# Patient Record
Sex: Female | Born: 1970
Health system: Southern US, Community
[De-identification: ages and names within clinical notes are randomized; demographics above are authoritative.]

## PROBLEM LIST (undated history)

## (undated) DIAGNOSIS — O2230 Deep phlebothrombosis in pregnancy, unspecified trimester: Secondary | ICD-10-CM

## (undated) DIAGNOSIS — D693 Immune thrombocytopenic purpura: Secondary | ICD-10-CM

## (undated) HISTORY — DX: Deep phlebothrombosis in pregnancy, unspecified trimester: O22.30

## (undated) HISTORY — DX: Immune thrombocytopenic purpura: D69.3

---

## 2014-04-07 ENCOUNTER — Other Ambulatory Visit (HOSPITAL_COMMUNITY)
Admission: RE | Admit: 2014-04-07 | Discharge: 2014-04-07 | Disposition: A | Discharge status: Home / Self Care | Payer: Managed Care, Other (non HMO) | Source: Ambulatory Visit | Attending: Obstetrics & Gynecology | Admitting: Obstetrics & Gynecology

## 2014-04-07 DIAGNOSIS — R87810 Cervical high risk human papillomavirus (HPV) DNA test positive: Secondary | ICD-10-CM | POA: Diagnosis not present

## 2014-04-07 DIAGNOSIS — Z124 Encounter for screening for malignant neoplasm of cervix: Secondary | ICD-10-CM | POA: Diagnosis not present

## 2014-04-07 DIAGNOSIS — Z1151 Encounter for screening for human papillomavirus (HPV): Secondary | ICD-10-CM | POA: Insufficient documentation

## 2014-04-20 ENCOUNTER — Other Ambulatory Visit: Payer: Self-pay

## 2014-04-20 DIAGNOSIS — Z1231 Encounter for screening mammogram for malignant neoplasm of breast: Secondary | ICD-10-CM

## 2014-04-29 ENCOUNTER — Ambulatory Visit
Admission: RE | Admit: 2014-04-29 | Discharge: 2014-04-29 | Disposition: A | Discharge status: Home / Self Care | Payer: Managed Care, Other (non HMO) | Source: Ambulatory Visit

## 2014-04-29 DIAGNOSIS — Z1231 Encounter for screening mammogram for malignant neoplasm of breast: Secondary | ICD-10-CM

## 2014-05-02 ENCOUNTER — Other Ambulatory Visit: Payer: Self-pay | Admitting: Obstetrics & Gynecology

## 2014-05-02 DIAGNOSIS — R928 Other abnormal and inconclusive findings on diagnostic imaging of breast: Secondary | ICD-10-CM

## 2014-05-12 ENCOUNTER — Ambulatory Visit
Admission: RE | Admit: 2014-05-12 | Discharge: 2014-05-12 | Disposition: A | Discharge status: Home / Self Care | Payer: Managed Care, Other (non HMO) | Source: Ambulatory Visit | Attending: Obstetrics & Gynecology | Admitting: Obstetrics & Gynecology

## 2014-05-12 DIAGNOSIS — R928 Other abnormal and inconclusive findings on diagnostic imaging of breast: Secondary | ICD-10-CM

## 2014-05-16 ENCOUNTER — Telehealth: Payer: Self-pay | Admitting: Oncology

## 2014-05-16 NOTE — Telephone Encounter (Signed)
S/W PT IN REF TO NP APPT. ON 05/25/14@10 :30 REFERRING DR Caro Hight

## 2014-05-17 ENCOUNTER — Telehealth: Payer: Self-pay | Admitting: Oncology

## 2014-05-17 NOTE — Telephone Encounter (Signed)
C/D 05/17/14 for appt. 05/25/14

## 2014-05-20 ENCOUNTER — Other Ambulatory Visit: Payer: Self-pay | Admitting: Oncology

## 2014-05-20 DIAGNOSIS — Z86718 Personal history of other venous thrombosis and embolism: Secondary | ICD-10-CM

## 2014-05-25 ENCOUNTER — Encounter (INDEPENDENT_AMBULATORY_CARE_PROVIDER_SITE_OTHER): Payer: Self-pay

## 2014-05-25 ENCOUNTER — Encounter: Payer: Self-pay | Admitting: Oncology

## 2014-05-25 ENCOUNTER — Ambulatory Visit (HOSPITAL_BASED_OUTPATIENT_CLINIC_OR_DEPARTMENT_OTHER): Payer: Managed Care, Other (non HMO)

## 2014-05-25 ENCOUNTER — Ambulatory Visit (HOSPITAL_BASED_OUTPATIENT_CLINIC_OR_DEPARTMENT_OTHER): Payer: Managed Care, Other (non HMO) | Admitting: Oncology

## 2014-05-25 ENCOUNTER — Telehealth: Payer: Self-pay | Admitting: Oncology

## 2014-05-25 ENCOUNTER — Other Ambulatory Visit (HOSPITAL_BASED_OUTPATIENT_CLINIC_OR_DEPARTMENT_OTHER): Payer: Managed Care, Other (non HMO)

## 2014-05-25 VITALS — BP 123/77 | HR 73 | Temp 97.6°F | Resp 18 | Wt 208.9 lb

## 2014-05-25 DIAGNOSIS — Z23 Encounter for immunization: Secondary | ICD-10-CM

## 2014-05-25 DIAGNOSIS — D693 Immune thrombocytopenic purpura: Secondary | ICD-10-CM

## 2014-05-25 DIAGNOSIS — Z86718 Personal history of other venous thrombosis and embolism: Secondary | ICD-10-CM

## 2014-05-25 DIAGNOSIS — O2230 Deep phlebothrombosis in pregnancy, unspecified trimester: Secondary | ICD-10-CM

## 2014-05-25 LAB — CBC WITH DIFFERENTIAL/PLATELET
BASO%: 1.3 % (ref 0.0–2.0)
BASOS ABS: 0.1 10*3/uL (ref 0.0–0.1)
EOS%: 4.1 % (ref 0.0–7.0)
Eosinophils Absolute: 0.3 10*3/uL (ref 0.0–0.5)
HCT: 45 % (ref 34.8–46.6)
HGB: 14.7 g/dL (ref 11.6–15.9)
LYMPH%: 48.6 % (ref 14.0–49.7)
MCH: 29 pg (ref 25.1–34.0)
MCHC: 32.6 g/dL (ref 31.5–36.0)
MCV: 88.9 fL (ref 79.5–101.0)
MONO#: 0.9 10*3/uL (ref 0.1–0.9)
MONO%: 12.1 % (ref 0.0–14.0)
NEUT#: 2.6 10*3/uL (ref 1.5–6.5)
NEUT%: 33.9 % — ABNORMAL LOW (ref 38.4–76.8)
Platelets: 255 10*3/uL (ref 145–400)
RBC: 5.06 10*6/uL (ref 3.70–5.45)
RDW: 13.4 % (ref 11.2–14.5)
WBC: 7.6 10*3/uL (ref 3.9–10.3)
lymph#: 3.7 10*3/uL — ABNORMAL HIGH (ref 0.9–3.3)

## 2014-05-25 LAB — COMPREHENSIVE METABOLIC PANEL (CC13)
ALBUMIN: 4.2 g/dL (ref 3.5–5.0)
ALT: 18 U/L (ref 0–55)
AST: 15 U/L (ref 5–34)
Alkaline Phosphatase: 40 U/L (ref 40–150)
Anion Gap: 8 mEq/L (ref 3–11)
BUN: 15.8 mg/dL (ref 7.0–26.0)
CO2: 27 mEq/L (ref 22–29)
Calcium: 10.1 mg/dL (ref 8.4–10.4)
Chloride: 106 mEq/L (ref 98–109)
Creatinine: 0.7 mg/dL (ref 0.6–1.1)
Glucose: 88 mg/dl (ref 70–140)
POTASSIUM: 4.2 meq/L (ref 3.5–5.1)
SODIUM: 140 meq/L (ref 136–145)
Total Bilirubin: 0.64 mg/dL (ref 0.20–1.20)
Total Protein: 6.9 g/dL (ref 6.4–8.3)

## 2014-05-25 LAB — CHCC SMEAR

## 2014-05-25 MED ORDER — INFLUENZA VAC SPLIT QUAD 0.5 ML IM SUSY
0.5000 mL | PREFILLED_SYRINGE | Freq: Once | INTRAMUSCULAR | Status: AC
  Administered 2014-05-25: 0.5 mL via INTRAMUSCULAR
  Filled 2014-05-25: qty 0.5

## 2014-05-25 NOTE — Telephone Encounter (Signed)
gv adn printed appt sched and avs for pt for Jan 2016 °

## 2014-05-25 NOTE — Progress Notes (Signed)
Please see consult note.  

## 2014-05-25 NOTE — Consult Note (Signed)
Reason for Referral: History of ITP and thrombosis   HPI: This is a 43 year old woman native of [place] but have been living in this area for the last 10 years. 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 The course of her disease has been chronic and relapsing at times. She required multiple therapies including a [place] as well as prednisone, IVIG and rituximab. Since she [place] [facility] [place], she was seen a hematologist in the [place] area [place] well as occasionally in [place]. Her last relapse was close to 3 years ago. She also had had a history of deep vein thrombosis around 2005. She was pregnant with twins was on bedrest. He developed postpartum DVT after a C-section and immobilization. 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 And she was placed on Coumadin indefinitely since that time. She has been on Coumadin since that time to about 2 weeks ago when her prescription ran out and she's been off.  Clinically, she is asymptomatic at this time. She had not had any blood clots for the last 10 years she did have an abortion because she was found not to be pregnant 3 months into her pregnancy while she was on Coumadin and she elected to abort the fetus. She did not have any pregnancy complications before that. She never had any other clotting symptoms previously. She does not have any family history of ITP or deep vein thrombosis. She does not report any headaches or blurry vision or migraines. She does not report any chest pain, palpitation, orthopnea or PND. Does not report any cough, wheezing or shortness of breath. Does not report any nausea, vomiting, hematochezia, melena. She has not reported any petechiae, ecchymosis or any bleeding. She has not reported any lymphadenopathy or any constitutional symptoms. She continues to work full-time and take care of her  twin children and they are close to 41 years old. Her similar review of systems unremarkable.   Past Medical History  Diagnosis Date  . ITP (idiopathic thrombocytopenic purpura)   . DVT (deep vein thrombosis) in pregnancy   :   Current Outpatient Prescriptions  Medication Sig Dispense Refill  . Cinnamon 500 MG capsule Take 1,000 mg by mouth.      . Linoleic Acid-Sunflower Oil (CLA) [phone] MG CAPS Take 1,000 mg by mouth.      . Multiple Vitamins tablet Take by mouth.      . Multiple Vitamins-Minerals (THERA-M) TABS Take 1 tablet by mouth.      . polycarbophil (FIBERCON) 625 MG tablet Take 625 mg by mouth.      Marland Kitchen. VITAMIN B1-B12 IM Inject into the muscle.      . phentermine 37.5 MG capsule Take 37.5 mg by mouth.      . warfarin (COUMADIN) 5 MG tablet Take 5 mg by mouth.       No current facility-administered medications for this visit.        Allergies  Allergen Reactions  . Doxycycline     Other reaction(s): Other (See Comments) Severe chest pain  :  No family history on file.:  History   Social History  . Marital Status: Unknown    Spouse Name: N/A    Number of Children: N/A  . Years of Education: N/A   Occupational History  . Not on file.   Social History Main Topics  .  Smoking status: Not on file  . Smokeless tobacco: Not on file  . Alcohol Use: Not on file  . Drug Use: Not on file  . Sexual Activity: Not on file   Other Topics Concern  . Not on file   Social History Narrative  . No narrative on file  :  Pertinent items are noted in HPI.  Exam: Blood pressure 123/77, pulse 73, temperature 97.6 F (36.4 C), temperature source Oral, resp. rate 18, weight 208 lb 14.4 oz (94.756 kg), SpO2 100.00%. General appearance: alert and cooperative Head: Normocephalic, without obvious abnormality Throat: lips, mucosa, and tongue normal; teeth and gums normal Neck: no adenopathy Back: symmetric, no curvature. ROM normal. No CVA tenderness. Resp: clear to  auscultation bilaterally Chest wall: no tenderness Cardio: regular rate and rhythm, S1, S2 normal, no murmur, click, rub or gallop GI: soft, non-tender; bowel sounds normal; no masses,  no organomegaly Extremities: extremities normal, atraumatic, no cyanosis or edema Pulses: 2+ and symmetric Skin: Skin color, texture, turgor normal. No rashes or lesions Lymph nodes: Cervical, supraclavicular, and axillary nodes normal.   Recent Labs  05/25/14 1110  WBC 7.6  HGB 14.7  HCT 45.0  PLT 255       Assessment and Plan:    43 year old woman with the following issues:  1. History of ITP: She appears to have been diagnosed as a child and a course of her disease has been chronic relapsing. She is status post splenectomy as well as many other treatments in the past that includes steroids, IVIG and Rituxan. Her last relapse was over 3 years ago and continues to be in remission. Her platelet count is perfectly normal at 255. The natural course of her disease was discussed and she is clearly well-informed since she have lived with this disease the majority of her life. I continue to indicate about signs and symptoms of relapse including petechiae, ecchymosis or bleeding. There is no need for treatment at this time and certainly we can treat her with prednisone plus or minus IVIG she develops relapse.  2. Deep vein thrombosis: She had 2 episodes they're very close to each other and possibly could be attributed it to 1 event. This episode occurred around 2005 after she was pregnant with twins, was on bedrest and required a C-section and immobilized postpartum. It is unclear to me whether her history mandates lifetime anticoagulation. Risks and benefits of full dose anticoagulation especially in the setting of relapsing ITP was discussed. The plan at this point is to obtain a hypercoagulable panel to rule out antiphospholipid antibody syndrome which could manifest itself with both ITP and thrombosis. If  she has no inherited thrombophilia, I am inclined to recommend low-dose aspirin for the time being and use full dose anticoagulation if she develops another clot. She would be a good candidate for Noel Christmas if she develops any other future blood clots.  3. Health maintenance: I have recommended that she establish care with a primary care physician for health maintenance issues.  4. Followup: Will be in 3 months to recheck her blood count.

## 2014-05-25 NOTE — Progress Notes (Signed)
Checked in new patient with no financial issues prior to seeing the dr. She has appt card and has not been out of the country, °

## 2014-05-28 LAB — HYPERCOAGULABLE PANEL, COMPREHENSIVE
ANTICARDIOLIPIN IGA: 4 U/mL (ref <22)
ANTICARDIOLIPIN IGG: 3 GPL U/mL (ref <23)
ANTITHROMB III FUNC: 113 % (ref 76–126)
Anticardiolipin IgM: 0 MPL U/mL (ref <11)
BETA-2-GLYCOPROTEIN I IGA: 2 A Units (ref <20)
Beta-2 Glyco I IgG: 1 G Units (ref <20)
Beta-2-Glycoprotein I IgM: 2 M Units (ref <20)
DRVVT: 29.6 secs (ref <42.9)
Lupus Anticoagulant: NOT DETECTED
PTT LA: 29.2 s (ref 28.0–43.0)
Protein C Activity: 151 % — ABNORMAL HIGH (ref 75–133)
Protein C, Total: 101 % (ref 72–160)
Protein S Activity: 71 % (ref 69–129)
Protein S Total: 77 % (ref 60–150)

## 2014-08-30 ENCOUNTER — Other Ambulatory Visit (HOSPITAL_BASED_OUTPATIENT_CLINIC_OR_DEPARTMENT_OTHER): Payer: Managed Care, Other (non HMO)

## 2014-08-30 ENCOUNTER — Telehealth: Payer: Self-pay | Admitting: Oncology

## 2014-08-30 ENCOUNTER — Ambulatory Visit (HOSPITAL_BASED_OUTPATIENT_CLINIC_OR_DEPARTMENT_OTHER): Payer: Managed Care, Other (non HMO) | Admitting: Oncology

## 2014-08-30 VITALS — BP 109/60 | HR 95 | Temp 97.7°F | Resp 20 | Wt 209.8 lb

## 2014-08-30 DIAGNOSIS — D693 Immune thrombocytopenic purpura: Secondary | ICD-10-CM

## 2014-08-30 DIAGNOSIS — Z86718 Personal history of other venous thrombosis and embolism: Secondary | ICD-10-CM

## 2014-08-30 DIAGNOSIS — O2230 Deep phlebothrombosis in pregnancy, unspecified trimester: Secondary | ICD-10-CM

## 2014-08-30 DIAGNOSIS — Z7982 Long term (current) use of aspirin: Secondary | ICD-10-CM

## 2014-08-30 DIAGNOSIS — Z23 Encounter for immunization: Secondary | ICD-10-CM

## 2014-08-30 LAB — CBC WITH DIFFERENTIAL/PLATELET
BASO%: 0.2 % (ref 0.0–2.0)
Basophils Absolute: 0 10*3/uL (ref 0.0–0.1)
EOS ABS: 0 10*3/uL (ref 0.0–0.5)
EOS%: 0.6 % (ref 0.0–7.0)
HCT: 44 % (ref 34.8–46.6)
HGB: 14.4 g/dL (ref 11.6–15.9)
LYMPH#: 2.9 10*3/uL (ref 0.9–3.3)
LYMPH%: 41.7 % (ref 14.0–49.7)
MCH: 28.9 pg (ref 25.1–34.0)
MCHC: 32.8 g/dL (ref 31.5–36.0)
MCV: 88 fL (ref 79.5–101.0)
MONO#: 1.1 10*3/uL — ABNORMAL HIGH (ref 0.1–0.9)
MONO%: 16.5 % — ABNORMAL HIGH (ref 0.0–14.0)
NEUT#: 2.8 10*3/uL (ref 1.5–6.5)
NEUT%: 41 % (ref 38.4–76.8)
Platelets: 234 10*3/uL (ref 145–400)
RBC: 4.99 10*6/uL (ref 3.70–5.45)
RDW: 13.5 % (ref 11.2–14.5)
WBC: 6.9 10*3/uL (ref 3.9–10.3)

## 2014-08-30 LAB — COMPREHENSIVE METABOLIC PANEL (CC13)
ALT: 19 U/L (ref 0–55)
AST: 18 U/L (ref 5–34)
Albumin: 4.1 g/dL (ref 3.5–5.0)
Alkaline Phosphatase: 40 U/L (ref 40–150)
Anion Gap: 8 mEq/L (ref 3–11)
BUN: 12 mg/dL (ref 7.0–26.0)
CO2: 28 mEq/L (ref 22–29)
Calcium: 9.2 mg/dL (ref 8.4–10.4)
Chloride: 103 mEq/L (ref 98–109)
Creatinine: 0.7 mg/dL (ref 0.6–1.1)
EGFR: >90 mL/min/{1.73_m2} (ref >90)
Glucose: 104 mg/dl (ref 70–140)
POTASSIUM: 4.3 meq/L (ref 3.5–5.1)
SODIUM: 139 meq/L (ref 136–145)
TOTAL PROTEIN: 6.4 g/dL (ref 6.4–8.3)
Total Bilirubin: 0.78 mg/dL (ref 0.20–1.20)

## 2014-08-30 NOTE — Telephone Encounter (Signed)
, °

## 2014-08-30 NOTE — Progress Notes (Signed)
Hematology and Oncology Follow Up Visit  Bonnie Mendez [id number] 02/25/1971 44 y.o. 08/30/2014 8:28 AM   Principle Diagnosis: 44 year old woman with a long-standing history of ITP diagnosed when she was 44 years of age. She also has deep vein thrombosis diagnosed in 2005 associated with pregnancy. She also developed another episode 2 weeks later.   Prior Therapy: She required multiple therapies including a splenectomy was done in [place] as well as prednisone, IVIG and rituximab. Since she relocated to [facility], she was seen a hematologist in the [place] area as well as occasionally in [place]. Her last relapse was close to 3 years ago.   She is status post anticoagulation with Coumadin after her deep pain thrombosis but she has been off of it as of late.   Current therapy: Observation and surveillance.  Interim History:  Ms. Bovee presents today for a follow-up visit. Since her last visit, she has been doing very well. She has not reported any bleeding complications such as epistaxis, petechiae or ecchymosis. She does not report any GI or GU bleeding. She had not had any other blood clots including DVT or pulmonary emboli. She did not have any phlebitis or pulmonary venous circulation. She does not report any chest pain, palpitation, orthopnea or PND. Does not report any cough, wheezing or shortness of breath. Does not report any nausea, vomiting, hematochezia, melena. She has not reported any lymphadenopathy or any constitutional symptoms. Rest of the review of systems unremarkable.  Medications: I have reviewed the patient's current medications.  Current Outpatient Prescriptions  Medication Sig Dispense Refill  . Cinnamon 500 MG capsule Take 1,000 mg by mouth.    . Linoleic Acid-Sunflower Oil (CLA) [phone] MG CAPS Take 1,000 mg by mouth.    . Multiple Vitamins tablet Take by mouth.    . Multiple Vitamins-Minerals (THERA-M) TABS Take 1 tablet by mouth.    . phentermine  37.5 MG capsule Take 37.5 mg by mouth.    . polycarbophil (FIBERCON) 625 MG tablet Take 625 mg by mouth.    Marland Kitchen VITAMIN B1-B12 IM Inject into the muscle.    . warfarin (COUMADIN) 5 MG tablet Take 5 mg by mouth.     No current facility-administered medications for this visit.     Allergies:  Allergies  Allergen Reactions  . Doxycycline     Other reaction(s): Other (See Comments) Severe chest pain    Past Medical History, Surgical history, Social history, and Family History were reviewed and updated.   Physical Exam: Blood pressure 109/60, pulse 95, temperature 97.7 F (36.5 C), temperature source Oral, resp. rate 20, weight 209 lb 12.8 oz (95.165 kg), SpO2 100 %. ECOG: 0  General appearance: alert and cooperative Head: Normocephalic, without obvious abnormality Neck: no adenopathy Lymph nodes: Cervical, supraclavicular, and axillary nodes normal. Heart:regular rate and rhythm, S1, S2 normal, no murmur, click, rub or gallop Lung:chest clear, no wheezing, rales, normal symmetric air entry Abdomin: soft, non-tender, without masses or organomegaly EXT:no erythema, induration, or nodules   Lab Results: Lab Results  Component Value Date   WBC 6.9 08/30/2014   HGB 14.4 08/30/2014   HCT 44.0 08/30/2014   MCV 88.0 08/30/2014   PLT 234 08/30/2014     Chemistry      Component Value Date/Time   NA 140 05/25/2014 1110   K 4.2 05/25/2014 1110   CO2 27 05/25/2014 1110   BUN 15.8 05/25/2014 1110   CREATININE 0.7 05/25/2014 1110      Component Value Date/Time  CALCIUM 10.1 05/25/2014 1110   ALKPHOS 40 05/25/2014 1110   AST 15 05/25/2014 1110   ALT 18 05/25/2014 1110   BILITOT 0.64 05/25/2014 1110         Impression and Plan:   44 year old woman with the following issues:  1. History of ITP: She appears to have been diagnosed as a child and a course of her disease has been chronic relapsing. She is status post splenectomy as well as many other treatments in the past  that includes steroids, IVIG and Rituxan. Her platelet count is perfectly normal at this time without any evidence of relapse. We'll continue to follow her periodically and educated her about signs and symptoms of relapsed ITP.  2. Deep vein thrombosis: She had 2 episodes they're very close to each other and possibly could be attributed it to 1 event. This episode occurred around 2005 after she was pregnant with twins, was on bedrest and required a C-section and immobilized postpartum. Her hypercoagulable workup repeated in October 2015 and showed no evidence of inherited thrombophilia. I recommended continuing aspirin at this time after discussing the risks and benefits of long-term full dose anticoagulation. She is agreeable at this time and she understands that she might require left on anticoagulation if he develops any future episodes.   3. Follow-up: Will be in 6 months.  [facility],Lyndzie Zentz, MD 1/12/20168:28 AM

## 2014-08-30 NOTE — Addendum Note (Signed)
Addended by: Reesa ChewSMITH, Vonzell Lindblad on: 08/30/2014 08:40 AM   Modules accepted: Medications

## 2014-11-04 ENCOUNTER — Other Ambulatory Visit (HOSPITAL_COMMUNITY)
Admission: RE | Admit: 2014-11-04 | Discharge: 2014-11-04 | Disposition: A | Discharge status: Home / Self Care | Payer: BLUE CROSS/BLUE SHIELD | Source: Ambulatory Visit | Attending: Obstetrics & Gynecology | Admitting: Obstetrics & Gynecology

## 2014-11-04 ENCOUNTER — Other Ambulatory Visit: Payer: Self-pay | Admitting: Obstetrics & Gynecology

## 2014-11-04 DIAGNOSIS — R87810 Cervical high risk human papillomavirus (HPV) DNA test positive: Secondary | ICD-10-CM | POA: Diagnosis present

## 2014-11-04 DIAGNOSIS — Z1151 Encounter for screening for human papillomavirus (HPV): Secondary | ICD-10-CM | POA: Insufficient documentation

## 2014-11-04 DIAGNOSIS — Z01411 Encounter for gynecological examination (general) (routine) with abnormal findings: Secondary | ICD-10-CM | POA: Insufficient documentation

## 2014-11-07 LAB — CYTOLOGY - PAP

## 2015-01-03 ENCOUNTER — Telehealth: Payer: Self-pay | Admitting: Oncology

## 2015-01-03 NOTE — Telephone Encounter (Signed)
Called and left a message with a new appointment r/s from 7/12

## 2015-01-04 ENCOUNTER — Telehealth: Payer: Self-pay | Admitting: Oncology

## 2015-01-04 ENCOUNTER — Telehealth: Payer: Self-pay

## 2015-01-04 ENCOUNTER — Other Ambulatory Visit (HOSPITAL_BASED_OUTPATIENT_CLINIC_OR_DEPARTMENT_OTHER): Payer: BLUE CROSS/BLUE SHIELD

## 2015-01-04 ENCOUNTER — Other Ambulatory Visit: Payer: Self-pay | Admitting: Oncology

## 2015-01-04 ENCOUNTER — Ambulatory Visit (HOSPITAL_BASED_OUTPATIENT_CLINIC_OR_DEPARTMENT_OTHER): Payer: BLUE CROSS/BLUE SHIELD | Admitting: Oncology

## 2015-01-04 VITALS — BP 133/72 | HR 71 | Temp 97.8°F | Resp 20 | Wt 206.6 lb

## 2015-01-04 DIAGNOSIS — D693 Immune thrombocytopenic purpura: Secondary | ICD-10-CM

## 2015-01-04 LAB — CBC WITH DIFFERENTIAL/PLATELET
BASO%: 0.4 % (ref 0.0–2.0)
BASOS ABS: 0 10*3/uL (ref 0.0–0.1)
EOS%: 3.5 % (ref 0.0–7.0)
Eosinophils Absolute: 0.3 10*3/uL (ref 0.0–0.5)
HCT: 44 % (ref 34.8–46.6)
HEMOGLOBIN: 15.4 g/dL (ref 11.6–15.9)
LYMPH#: 4.4 10*3/uL — AB (ref 0.9–3.3)
LYMPH%: 49.6 % (ref 14.0–49.7)
MCH: 30 pg (ref 25.1–34.0)
MCHC: 35 g/dL (ref 31.5–36.0)
MCV: 85.6 fL (ref 79.5–101.0)
MONO#: 1.2 10*3/uL — AB (ref 0.1–0.9)
MONO%: 13.7 % (ref 0.0–14.0)
NEUT#: 2.9 10*3/uL (ref 1.5–6.5)
NEUT%: 32.8 % — AB (ref 38.4–76.8)
Platelets: 3 10*3/uL — CL (ref 145–400)
RBC: 5.14 10*6/uL (ref 3.70–5.45)
RDW: 13.8 % (ref 11.2–14.5)
WBC: 8.9 10*3/uL (ref 3.9–10.3)
nRBC: 0 % (ref 0–0)

## 2015-01-04 LAB — TECHNOLOGIST REVIEW

## 2015-01-04 MED ORDER — DEXAMETHASONE 4 MG PO TABS: ORAL_TABLET | ORAL | Status: DC

## 2015-01-04 NOTE — Telephone Encounter (Signed)
s.w. pt and advised on todays appt.....pt ok and aware °

## 2015-01-04 NOTE — Telephone Encounter (Signed)
Gave and printed appt sched and avs for pt for May and JUNE °

## 2015-01-04 NOTE — Progress Notes (Signed)
Hematology and Oncology Follow Up Visit  Phill MutterJennifer Weidemann [id number] 1971-05-08 44 y.o. 01/04/2015 3:03 PM   Principle Diagnosis: 44 year old woman with a long-standing history of ITP diagnosed when she was 44 years of age. She also has deep vein thrombosis diagnosed in 2005 associated with pregnancy. She also developed another episode 2 weeks later.   Prior Therapy: She required multiple therapies including a splen[place] was done in [place] as well as prednisone, IVIG and rituximab. Since she relocated to [place], she was seen a hematologist in the [place] area as well as occasionally in [place]. Her last relapse was close to 3 years ago.   She is status post anticoagulation with Coumadin after her deep pain thrombosis but she has been off of it as of late.   Current therapy: Observation and surveillance.  Interim History:  Ms. Lu DuffelStuart presents today for a walk-in appointment. Since her last visit, she has been doing very well up until the last 24 hours. She developed acute onset of easy bruisability, and petechiae noted on her lower extremities. She reported that generally overall good health but have been under stress at work. 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 Today she noticed some bleeding after blowing her nose and brushing her teeth and call us today for a follow-up. She does not report any chest pain, palpitation, orthopnea or PND. Does not report any cough, wheezing or shortness of breath. Does not report any nausea, vomiting, hematochezia, melena. She has not reported any lymphadenopathy or any constitutional symptoms. Rest of the review of systems unremarkable.  Medications: I have reviewed the patient's current medications.  Current Outpatient Prescriptions  Medication Sig Dispense Refill  . Cinnamon 500 MG capsule Take 1,000 mg by mouth.    . Linoleic Acid-Sunflower Oil (CLA) [phone] MG CAPS Take 1,000  mg by mouth.    . Multiple Vitamins-Minerals (THERA-M) TABS Take 1 tablet by mouth.    . phentermine 37.5 MG capsule Take 37.5 mg by mouth.    . polycarbophil (FIBERCON) 625 MG tablet Take 625 mg by mouth.    Marland Kitchen. VITAMIN B1-B12 IM Inject into the muscle.    Marland Kitchen. dexamethasone (DECADRON) 4 MG tablet Take 10 tablets daily for 4 days. 40 tablet 0   No current facility-administered medications for this visit.     Allergies:  Allergies  Allergen Reactions  . Doxycycline Other (See Comments)    Severe chest pain    Past Medical History, Surgical history, Social history, and Family History were reviewed and updated.   Physical Exam: Blood pressure 133/72, pulse 71, temperature 97.8 F (36.6 C), temperature source Oral, resp. rate 20, weight 206 lb 9.6 oz (93.713 kg), SpO2 100 %. ECOG: 0  General appearance: alert and cooperative Head: Normocephalic, without obvious abnormality Neck: no adenopathy  Oral mucosa is moist and pink without any mucosal bleeding. Lymph nodes: Cervical, supraclavicular, and axillary nodes normal. Heart:regular rate and rhythm, S1, S2 normal, no murmur, click, rub or gallop Lung:chest clear, no wheezing, rales, normal symmetric air entry Abdomin: soft, non-tender, without masses or organomegaly EXT:no erythema, induration, or nodules.  Skin: Petechiae noted on her lower extremities. Very little to no ecchymosis noted.    Lab Results: Lab Results  Component Value Date   WBC 8.9 01/04/2015   HGB 15.4 01/04/2015   HCT 44.0 01/04/2015   MCV 85.6 01/04/2015   PLT 3 Large & giant platelets* 01/04/2015  Chemistry      Component Value Date/Time   NA 139 08/30/2014 0807   K 4.3 08/30/2014 0807   CO2 28 08/30/2014 0807   BUN 12.0 08/30/2014 0807   CREATININE 0.7 08/30/2014 0807      Component Value Date/Time   CALCIUM 9.2 08/30/2014 0807   ALKPHOS 40 08/30/2014 0807   AST 18 08/30/2014 0807   ALT 19 08/30/2014 0807   BILITOT 0.78 08/30/2014 0807          Impression and Plan:   44 year old woman with the following issues:  1. History of ITP: She appears to have been diagnosed as a child and a course of her disease has been chronic relapsing. She is status post splenectomy as well as many other treatments in the past that includes steroids, IVIG and Rituxan.   She is a acute onset of petechiae and very little epistaxis. Her platelet count is 3000 confirmed by peripheral smear today. She clearly have relapse at this time which she has done before multiple times. She usually responds well to dexamethasone and prednisone.  I have elected to proceed with a short course of dexamethasone at 40 mg daily for 4 days. I plan on repeating her CBC next week and have her platelet counts not completely normal, I have scheduled her tentatively for IVIG as well. Risks and benefits of steroids were discussed. These would include hyperglycemia, hyperactivity, anxiety and dyspepsia. Complications from IVIG including infusion related complications among others. She is very familiar with these drugs and she received them multiple times in the past.  I gave her clear instructions if she develops persistent epistaxis, hematochezia, melena, or headaches to present urgently to the emergency department.  2. Deep vein thrombosis: She had 2 episodes they're very close to each other and possibly could be attributed it to 1 event. This episode occurred around 2005 after she was pregnant with twins, was on bedrest and required a C-section and immobilized postpartum. Her hypercoagulable workup repeated in October 2015 and showed no evidence of inherited thrombophilia.  I recommend discontinue aspirin at this time given her relapse of her ITP.   3. Follow-up: Will be next week for follow-up regarding her platelet count.  [facility],Kala Gassmann, MD 5/18/20163:03 PM

## 2015-01-04 NOTE — Telephone Encounter (Addendum)
[id number] Pt called stating she needs her labs checked. She has ITP. She has petechiae and starting to bleed.

## 2015-01-04 NOTE — Telephone Encounter (Signed)
She needs to be seen today with a CBC (3:15 MD). Message sent to the schedulers.

## 2015-01-04 NOTE — Telephone Encounter (Signed)
She is having small nose bleeds. She is seeing bruises. When she shaves the nicks bleed longer. She sees petechia in the back of her throat as well.

## 2015-01-10 ENCOUNTER — Other Ambulatory Visit: Payer: Self-pay | Admitting: Oncology

## 2015-01-10 ENCOUNTER — Ambulatory Visit (HOSPITAL_BASED_OUTPATIENT_CLINIC_OR_DEPARTMENT_OTHER): Payer: BLUE CROSS/BLUE SHIELD

## 2015-01-10 ENCOUNTER — Other Ambulatory Visit (HOSPITAL_BASED_OUTPATIENT_CLINIC_OR_DEPARTMENT_OTHER): Payer: BLUE CROSS/BLUE SHIELD

## 2015-01-10 VITALS — BP 106/60 | HR 73 | Temp 98.2°F | Resp 18

## 2015-01-10 DIAGNOSIS — O2230 Deep phlebothrombosis in pregnancy, unspecified trimester: Secondary | ICD-10-CM

## 2015-01-10 DIAGNOSIS — D693 Immune thrombocytopenic purpura: Secondary | ICD-10-CM

## 2015-01-10 LAB — CBC WITH DIFFERENTIAL/PLATELET
BASO%: 0.1 % (ref 0.0–2.0)
Basophils Absolute: 0 10*3/uL (ref 0.0–0.1)
EOS ABS: 0.5 10*3/uL (ref 0.0–0.5)
EOS%: 2.9 % (ref 0.0–7.0)
HCT: 45.1 % (ref 34.8–46.6)
HGB: 15.4 g/dL (ref 11.6–15.9)
LYMPH%: 63.1 % — ABNORMAL HIGH (ref 14.0–49.7)
MCH: 29.7 pg (ref 25.1–34.0)
MCHC: 34.1 g/dL (ref 31.5–36.0)
MCV: 87.1 fL (ref 79.5–101.0)
MONO#: 1.4 10*3/uL — ABNORMAL HIGH (ref 0.1–0.9)
MONO%: 9.1 % (ref 0.0–14.0)
NEUT#: 3.9 10*3/uL (ref 1.5–6.5)
NEUT%: 24.8 % — ABNORMAL LOW (ref 38.4–76.8)
Platelets: 8 10*3/uL — CL (ref 145–400)
RBC: 5.18 10*6/uL (ref 3.70–5.45)
RDW: 14.2 % (ref 11.2–14.5)
WBC: 15.7 10*3/uL — ABNORMAL HIGH (ref 3.9–10.3)
lymph#: 9.9 10*3/uL — ABNORMAL HIGH (ref 0.9–3.3)
nRBC: 0 % (ref 0–0)

## 2015-01-10 MED ORDER — PREDNISONE 20 MG PO TABS: ORAL_TABLET | ORAL | Status: DC

## 2015-01-10 MED ORDER — ACETAMINOPHEN 325 MG PO TABS
ORAL_TABLET | ORAL | Status: AC
  Filled 2015-01-10: qty 2

## 2015-01-10 MED ORDER — DIPHENHYDRAMINE HCL 25 MG PO TABS: 25.0000 mg | ORAL_TABLET | Freq: Once | ORAL | Status: DC

## 2015-01-10 MED ORDER — SODIUM CHLORIDE 0.9 % IV SOLN
Freq: Once | INTRAVENOUS | Status: AC
  Administered 2015-01-10: 10:00:00 via INTRAVENOUS

## 2015-01-10 MED ORDER — DIPHENHYDRAMINE HCL 25 MG PO CAPS
ORAL_CAPSULE | ORAL | Status: AC
  Filled 2015-01-10: qty 1

## 2015-01-10 MED ORDER — IMMUNE GLOBULIN (HUMAN) 20 GM/200ML IV SOLN
90.0000 g | INTRAVENOUS | Status: DC
  Filled 2015-01-10: qty 100

## 2015-01-10 MED ORDER — ACETAMINOPHEN 325 MG PO TABS
650.0000 mg | ORAL_TABLET | Freq: Four times a day (QID) | ORAL | Status: DC | PRN
  Administered 2015-01-10: 650 mg via ORAL

## 2015-01-10 MED ORDER — IMMUNE GLOBULIN (HUMAN) 10 GM/100ML IV SOLN
1.0000 g/kg | Freq: Once | INTRAVENOUS | Status: DC
  Filled 2015-01-10: qty 950

## 2015-01-10 MED ORDER — IMMUNE GLOBULIN (HUMAN) 10 GM/100ML IV SOLN
90.0000 g | Freq: Once | INTRAVENOUS | Status: AC
  Administered 2015-01-10: 90 g via INTRAVENOUS
  Filled 2015-01-10: qty 900

## 2015-01-10 MED ORDER — DIPHENHYDRAMINE HCL 25 MG PO CAPS
25.0000 mg | ORAL_CAPSULE | Freq: Once | ORAL | Status: AC
Start: 2015-01-10 — End: 2015-01-10
  Administered 2015-01-10: 25 mg via ORAL

## 2015-01-10 NOTE — Patient Instructions (Signed)

## 2015-01-10 NOTE — Progress Notes (Signed)
Tylenol ordered PRN, clarified with Reesa Chewixie Smith, RN. Per [place], Dr. Alver FisherShadad's RN, patient is to receive tylenol PO prior to IVIG.

## 2015-01-11 ENCOUNTER — Ambulatory Visit (HOSPITAL_BASED_OUTPATIENT_CLINIC_OR_DEPARTMENT_OTHER): Payer: BLUE CROSS/BLUE SHIELD

## 2015-01-11 ENCOUNTER — Telehealth: Payer: Self-pay | Admitting: *Deleted

## 2015-01-11 ENCOUNTER — Telehealth: Payer: Self-pay | Admitting: Oncology

## 2015-01-11 VITALS — BP 117/65 | HR 76 | Temp 98.2°F | Resp 17

## 2015-01-11 DIAGNOSIS — D693 Immune thrombocytopenic purpura: Secondary | ICD-10-CM | POA: Diagnosis not present

## 2015-01-11 DIAGNOSIS — O2230 Deep phlebothrombosis in pregnancy, unspecified trimester: Secondary | ICD-10-CM

## 2015-01-11 MED ORDER — DIPHENHYDRAMINE HCL 25 MG PO CAPS
ORAL_CAPSULE | ORAL | Status: AC
  Filled 2015-01-11: qty 1

## 2015-01-11 MED ORDER — ACETAMINOPHEN 325 MG PO TABS
ORAL_TABLET | ORAL | Status: AC
  Filled 2015-01-11: qty 2

## 2015-01-11 MED ORDER — ACETAMINOPHEN 325 MG PO TABS
650.0000 mg | ORAL_TABLET | Freq: Once | ORAL | Status: AC
  Administered 2015-01-11: 650 mg via ORAL

## 2015-01-11 MED ORDER — IMMUNE GLOBULIN (HUMAN) 10 GM/100ML IV SOLN
90.0000 g | Freq: Once | INTRAVENOUS | Status: AC
  Administered 2015-01-11: 90 g via INTRAVENOUS
  Filled 2015-01-11: qty 900

## 2015-01-11 MED ORDER — DIPHENHYDRAMINE HCL 25 MG PO CAPS
25.0000 mg | ORAL_CAPSULE | Freq: Once | ORAL | Status: AC
  Administered 2015-01-11: 25 mg via ORAL

## 2015-01-11 MED ORDER — SODIUM CHLORIDE 0.9 % IV SOLN
Freq: Once | INTRAVENOUS | Status: AC
  Administered 2015-01-11: 10:00:00 via INTRAVENOUS

## 2015-01-11 NOTE — Telephone Encounter (Signed)
Moved 6/1 lab/fu to 12:45pm due to center event. Left message for patient and mailed schedule.

## 2015-01-11 NOTE — Telephone Encounter (Signed)
Pt. Is in infusion today for 2nd day of IVIG and would like a note to her work to be excused from work the rest of the week and return to work on Tuesday, 01/17/15

## 2015-01-11 NOTE — Patient Instructions (Signed)

## 2015-01-18 ENCOUNTER — Telehealth: Payer: Self-pay | Admitting: Oncology

## 2015-01-18 ENCOUNTER — Other Ambulatory Visit (HOSPITAL_BASED_OUTPATIENT_CLINIC_OR_DEPARTMENT_OTHER): Payer: BLUE CROSS/BLUE SHIELD

## 2015-01-18 ENCOUNTER — Ambulatory Visit (HOSPITAL_BASED_OUTPATIENT_CLINIC_OR_DEPARTMENT_OTHER): Payer: BLUE CROSS/BLUE SHIELD | Admitting: Oncology

## 2015-01-18 VITALS — BP 116/68 | HR 93 | Temp 98.5°F | Resp 20 | Wt 214.1 lb

## 2015-01-18 DIAGNOSIS — D693 Immune thrombocytopenic purpura: Secondary | ICD-10-CM

## 2015-01-18 LAB — CBC WITH DIFFERENTIAL/PLATELET
BASO%: 0.1 % (ref 0.0–2.0)
BASOS ABS: 0 10*3/uL (ref 0.0–0.1)
EOS%: 0.1 % (ref 0.0–7.0)
Eosinophils Absolute: 0 10*3/uL (ref 0.0–0.5)
HCT: 40.2 % (ref 34.8–46.6)
HEMOGLOBIN: 13.7 g/dL (ref 11.6–15.9)
LYMPH%: 17.1 % (ref 14.0–49.7)
MCH: 29.8 pg (ref 25.1–34.0)
MCHC: 34.1 g/dL (ref 31.5–36.0)
MCV: 87.4 fL (ref 79.5–101.0)
MONO#: 0.5 10*3/uL (ref 0.1–0.9)
MONO%: 3.2 % (ref 0.0–14.0)
NEUT#: 13 10*3/uL — ABNORMAL HIGH (ref 1.5–6.5)
NEUT%: 79.5 % — ABNORMAL HIGH (ref 38.4–76.8)
PLATELETS: 416 10*3/uL — AB (ref 145–400)
RBC: 4.6 10*6/uL (ref 3.70–5.45)
RDW: 14.9 % — ABNORMAL HIGH (ref 11.2–14.5)
WBC: 16.3 10*3/uL — ABNORMAL HIGH (ref 3.9–10.3)
lymph#: 2.8 10*3/uL (ref 0.9–3.3)

## 2015-01-18 NOTE — Progress Notes (Signed)
Hematology and Oncology Follow Up Visit  Bonnie Mendez [id number] 08-15-1971 44 y.o. 01/18/2015 1:22 PM   Principle Diagnosis: 44 year old woman with a long-standing history of ITP diagnosed when she was 44 years of age. She also has deep vein thrombosis diagnosed in 2005 associated with pregnancy. She also developed another episode 2 weeks later.   Prior Therapy: She required multiple therapies including a splenectomy was done in [place] as well as prednisone, IVIG and rituximab. Since she relocated to [facility], she was seen a hematologist in the [place] area as well as occasionally in [place]. Her last relapse was close to 3 years ago.   She is status post anticoagulation with Coumadin after her deep pain thrombosis but she has been off of it as of late.   She relapsed recently required restarted her prednisone as well as IVIG given at a total of 1 g/kg on 01/11/2015 as well as 01/10/2015.  Current therapy: Prednisone 60 mg daily started on 01/10/2015.  Interim History:  [name] presents today for a follow-up visit. Since her last visit, she started prednisone 60 mg daily for relapsed ITP she also received IVIG on 2 separate sessions on 5/24 and 01/11/2015. She tolerated therapy well without any complications. She did not have any infusion-related complications. She is reporting few complications related to prednisone. These would include insomnia, fatigue, emotional instability. She also had some dysphagia at times. She is not reporting any petechiae or ecchymosis. Has not reported any hematochezia or melanoma. Has not reported any headaches or blurry vision.  She does not report any chest pain, palpitation, orthopnea or PND. Does not report any cough, wheezing or shortness of breath. Does not report any nausea, vomiting, hematochezia, melena. She has not reported any lymphadenopathy or any constitutional symptoms. Rest of the review of systems unremarkable.  Medications: I  have reviewed the patient's current medications.  Current Outpatient Prescriptions  Medication Sig Dispense Refill  . Cinnamon 500 MG capsule Take 1,000 mg by mouth.    . Linoleic Acid-Sunflower Oil (CLA) [phone] MG CAPS Take 1,000 mg by mouth.    . Multiple Vitamins-Minerals (THERA-M) TABS Take 1 tablet by mouth.    . phentermine 37.5 MG capsule Take 37.5 mg by mouth.    . polycarbophil (FIBERCON) 625 MG tablet Take 625 mg by mouth.    . predniSONE (DELTASONE) 20 MG tablet Take three tablets daily. 90 tablet 1  . VITAMIN B1-B12 IM Inject into the muscle.     No current facility-administered medications for this visit.     Allergies:  Allergies  Allergen Reactions  . Doxycycline Other (See Comments)    Severe chest pain    Past Medical History, Surgical history, Social history, and Family History were reviewed and updated.   Physical Exam: Blood pressure 116/68, pulse 93, temperature 98.5 F (36.9 C), temperature source Oral, resp. rate 20, weight 214 lb 1.6 oz (97.115 kg), SpO2 98 %. ECOG: 0  General appearance: alert and cooperative Head: Normocephalic, without obvious abnormality Neck: no adenopathy  Oral mucosa is moist and pink without any mucosal bleeding. Lymph nodes: Cervical, supraclavicular, and axillary nodes normal. Heart:regular rate and rhythm, S1, S2 normal, no murmur, click, rub or gallop Lung:chest clear, no wheezing, rales, normal symmetric air entry Abdomin: soft, non-tender, without masses or organomegaly EXT:no erythema, induration, or nodules.  Skin: No petechiae or ecchymosis noted.    Lab Results: Lab Results  Component Value Date   WBC 16.3* 01/18/2015   HGB 13.7 01/18/2015  HCT 40.2 01/18/2015   MCV 87.4 01/18/2015   PLT 416* 01/18/2015     Chemistry      Component Value Date/Time   NA 139 08/30/2014 0807   K 4.3 08/30/2014 0807   CO2 28 08/30/2014 0807   BUN 12.0 08/30/2014 0807   CREATININE 0.7 08/30/2014 0807      Component  Value Date/Time   CALCIUM 9.2 08/30/2014 0807   ALKPHOS 40 08/30/2014 0807   AST 18 08/30/2014 0807   ALT 19 08/30/2014 0807   BILITOT 0.78 08/30/2014 0807         Impression and Plan:   44 year old woman with the following issues:  1. History of ITP: She appears to have been diagnosed as a child and a course of her disease has been chronic relapsing. She is status post splenectomy as well as many other treatments in the past that includes steroids, IVIG and Rituxan.   She relapsed in May 2016 after presenting with petechiae and platelet count of less than 5000. After IVIG and prednisone 60 mg daily she achieved complete hematological response. Her platelet count is normal at this time. The plan is to proceed with a slow prednisone taper with a 10 mg every week. I anticipate that she'll be off prednisone in the next 5-6 weeks. I will check her counts during the tapering process as well as afterwards.   2. Follow-up: Will be in 3 weeks to check her counts and a month after that to check on her status as well.  [facility],Bina Veenstra, MD 6/1/20161:22 PM

## 2015-01-18 NOTE — Telephone Encounter (Signed)
Gave patient avs report and appointments for June and July. Lab/FS 7/20 cancelled due to per 6/1 pof lab 6/22 and lab/AJ 7/27.

## 2015-02-08 ENCOUNTER — Other Ambulatory Visit (HOSPITAL_BASED_OUTPATIENT_CLINIC_OR_DEPARTMENT_OTHER): Payer: BLUE CROSS/BLUE SHIELD

## 2015-02-08 ENCOUNTER — Other Ambulatory Visit: Payer: Self-pay | Admitting: *Deleted

## 2015-02-08 ENCOUNTER — Telehealth: Payer: Self-pay | Admitting: *Deleted

## 2015-02-08 DIAGNOSIS — D693 Immune thrombocytopenic purpura: Secondary | ICD-10-CM

## 2015-02-08 DIAGNOSIS — O2230 Deep phlebothrombosis in pregnancy, unspecified trimester: Secondary | ICD-10-CM

## 2015-02-08 LAB — CBC WITH DIFFERENTIAL/PLATELET
BASO%: 0.2 % (ref 0.0–2.0)
BASOS ABS: 0 10*3/uL (ref 0.0–0.1)
EOS ABS: 0 10*3/uL (ref 0.0–0.5)
EOS%: 0.1 % (ref 0.0–7.0)
HEMATOCRIT: 42.3 % (ref 34.8–46.6)
HEMOGLOBIN: 14.1 g/dL (ref 11.6–15.9)
LYMPH%: 10.6 % — AB (ref 14.0–49.7)
MCH: 30.3 pg (ref 25.1–34.0)
MCHC: 33.4 g/dL (ref 31.5–36.0)
MCV: 90.7 fL (ref 79.5–101.0)
MONO#: 0.3 10*3/uL (ref 0.1–0.9)
MONO%: 2.6 % (ref 0.0–14.0)
NEUT%: 86.5 % — ABNORMAL HIGH (ref 38.4–76.8)
NEUTROS ABS: 9.5 10*3/uL — AB (ref 1.5–6.5)
Platelets: 238 10*3/uL (ref 145–400)
RBC: 4.66 10*6/uL (ref 3.70–5.45)
RDW: 14.7 % — ABNORMAL HIGH (ref 11.2–14.5)
WBC: 10.9 10*3/uL — ABNORMAL HIGH (ref 3.9–10.3)
lymph#: 1.2 10*3/uL (ref 0.9–3.3)

## 2015-02-08 LAB — COMPREHENSIVE METABOLIC PANEL (CC13)
ALT: 26 U/L (ref 0–55)
AST: 20 U/L (ref 5–34)
Albumin: 3.9 g/dL (ref 3.5–5.0)
Alkaline Phosphatase: 28 U/L — ABNORMAL LOW (ref 40–150)
Anion Gap: 8 mEq/L (ref 3–11)
BILIRUBIN TOTAL: 0.74 mg/dL (ref 0.20–1.20)
BUN: 18.5 mg/dL (ref 7.0–26.0)
CALCIUM: 10.1 mg/dL (ref 8.4–10.4)
CHLORIDE: 105 meq/L (ref 98–109)
CO2: 27 mEq/L (ref 22–29)
Creatinine: 0.7 mg/dL (ref 0.6–1.1)
EGFR: >90 mL/min/{1.73_m2} (ref >90)
Glucose: 104 mg/dl (ref 70–140)
Potassium: 4.5 mEq/L (ref 3.5–5.1)
Sodium: 140 mEq/L (ref 136–145)
Total Protein: 7.2 g/dL (ref 6.4–8.3)

## 2015-02-08 NOTE — Telephone Encounter (Signed)
-----   Message from Benjiman Core, MD sent at 02/08/2015 11:42 AM EDT ----- Please let her know that platelet count is normal. Keep doing what she is doing.

## 2015-02-08 NOTE — Telephone Encounter (Signed)
Per Dr. Clelia Croft, I left a message on patient's cell phone regarding her platelet count today. Informed patient to call if she had any further questions.

## 2015-02-28 ENCOUNTER — Ambulatory Visit: Payer: Managed Care, Other (non HMO) | Admitting: Oncology

## 2015-02-28 ENCOUNTER — Other Ambulatory Visit: Payer: Managed Care, Other (non HMO)

## 2015-03-08 ENCOUNTER — Other Ambulatory Visit: Payer: Managed Care, Other (non HMO)

## 2015-03-08 ENCOUNTER — Ambulatory Visit: Payer: Managed Care, Other (non HMO) | Admitting: Oncology

## 2015-03-15 ENCOUNTER — Telehealth: Payer: Self-pay | Admitting: Physician Assistant

## 2015-03-15 ENCOUNTER — Ambulatory Visit (HOSPITAL_BASED_OUTPATIENT_CLINIC_OR_DEPARTMENT_OTHER): Payer: BLUE CROSS/BLUE SHIELD | Admitting: Physician Assistant

## 2015-03-15 ENCOUNTER — Encounter: Payer: Self-pay | Admitting: Physician Assistant

## 2015-03-15 ENCOUNTER — Other Ambulatory Visit (HOSPITAL_BASED_OUTPATIENT_CLINIC_OR_DEPARTMENT_OTHER): Payer: BLUE CROSS/BLUE SHIELD

## 2015-03-15 VITALS — BP 115/67 | HR 74 | Temp 98.5°F | Resp 20 | Wt 217.9 lb

## 2015-03-15 DIAGNOSIS — D693 Immune thrombocytopenic purpura: Secondary | ICD-10-CM

## 2015-03-15 DIAGNOSIS — O2230 Deep phlebothrombosis in pregnancy, unspecified trimester: Secondary | ICD-10-CM | POA: Diagnosis not present

## 2015-03-15 HISTORY — DX: Immune thrombocytopenic purpura: D69.3

## 2015-03-15 LAB — CBC WITH DIFFERENTIAL/PLATELET
BASO%: 1.1 % (ref 0.0–2.0)
Basophils Absolute: 0.1 10*3/uL (ref 0.0–0.1)
EOS%: 6.6 % (ref 0.0–7.0)
Eosinophils Absolute: 0.4 10*3/uL (ref 0.0–0.5)
HEMATOCRIT: 45.1 % (ref 34.8–46.6)
HGB: 15.4 g/dL (ref 11.6–15.9)
LYMPH%: 36.4 % (ref 14.0–49.7)
MCH: 30.4 pg (ref 25.1–34.0)
MCHC: 34.1 g/dL (ref 31.5–36.0)
MCV: 89.4 fL (ref 79.5–101.0)
MONO#: 0.8 10*3/uL (ref 0.1–0.9)
MONO%: 11.1 % (ref 0.0–14.0)
NEUT%: 44.8 % (ref 38.4–76.8)
NEUTROS ABS: 3 10*3/uL (ref 1.5–6.5)
Platelets: 236 10*3/uL (ref 145–400)
RBC: 5.05 10*6/uL (ref 3.70–5.45)
RDW: 13.4 % (ref 11.2–14.5)
WBC: 6.8 10*3/uL (ref 3.9–10.3)
lymph#: 2.5 10*3/uL (ref 0.9–3.3)

## 2015-03-15 NOTE — Telephone Encounter (Signed)
per pof to sch pt appt-gave pt copy of avs °

## 2015-03-15 NOTE — Progress Notes (Signed)
Hematology and Oncology Follow Up Visit  Bonnie Mendez [id number] [birth year] 44 y.o. 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 She also has deep vein thrombosis diagnosed in 2005 associated with pregnancy. She also developed another episode 2 weeks later.   Prior Therapy: She required multiple therapies including a splenectomy was done in [place] as well as prednisone, IVIG and rituximab. Since she relocated to [facility], she was seen a hematologist in the [place] area as well as occasionally in [place]. Her last relapse was close to 3 years ago.   She is status post anticoagulation with Coumadin after her deep pain thrombosis but she has been off of it as of late.   She relapsed recently required restarted her prednisone as well as IVIG given at a total of 1 g/kg on 01/11/2015 as well as 01/10/2015.  Current therapy: Prednisone 60 mg daily started on 01/10/2015.  Interim History:  Ms. Fielder presents today for a follow-up visit. Since her last visit, she completed her prednisone taper  for relapsed ITP she also received IVIG on 2 separate sessions on 5/24 and 01/11/2015. She tolerated therapy well without any complications. She did not have any infusion-related complications.  She has gained weight related to the recent prednisone taper.also had some dysphagia at times. She is not reporting any petechiae but has a few areas of mild ecchymosis. Has not reported any hematochezia or melanoma. Has not reported any headaches or blurry vision.  She does not report any chest pain, palpitation, orthopnea or PND. Does not report any cough, wheezing or shortness of breath. Does not report any nausea, vomiting, hematochezia, melena. She has not reported any lymphadenopathy or any constitutional symptoms. Rest of the review of systems unremarkable.  Medications: I have reviewed the patient's current  medications.  Current Outpatient Prescriptions  Medication Sig Dispense Refill  . Cinnamon 500 MG capsule Take 1,000 mg by mouth.    . Linoleic Acid-Sunflower Oil (CLA) [phone] MG CAPS Take 1,000 mg by mouth.    . Multiple Vitamins-Minerals (THERA-M) TABS Take 1 tablet by mouth.    . phentermine 37.5 MG capsule Take 37.5 mg by mouth.    . polycarbophil (FIBERCON) 625 MG tablet Take 625 mg by mouth.    Marland Kitchen VITAMIN B1-B12 IM Inject into the muscle.     No current facility-administered medications for this visit.     Allergies:  Allergies  Allergen Reactions  . Doxycycline Other (See Comments)    Severe chest pain    Past Medical History, Surgical history, Social history, and Family History were reviewed and updated.   Physical Exam: Blood pressure 115/67, pulse 74, temperature 98.5 F (36.9 C), temperature source Oral, resp. rate 20, weight 217 lb 14.4 oz (98.839 kg), SpO2 100 %. ECOG: 0  General appearance: alert and cooperative Head: Normocephalic, without obvious abnormality Neck: no adenopathy  Oral mucosa is moist and pink without any mucosal bleeding. Lymph nodes: Cervical, supraclavicular, and axillary nodes normal. Heart:regular rate and rhythm, S1, S2 normal, no murmur, click, rub or gallop Lung:chest clear, no wheezing, rales, normal symmetric air entry Abdomin: soft, non-tender, without masses or organomegaly EXT:no erythema, induration, or nodules.  Skin: No petechiae or ecchymosis noted.    Lab Results: Lab Results  Component Value Date   WBC 6.8 03/15/2015   HGB 15.4 03/15/2015   HCT 45.1 03/15/2015   MCV 89.4 03/15/2015   PLT 236 03/15/2015  Chemistry      Component Value Date/Time   NA 140 02/08/2015 0952   K 4.5 02/08/2015 0952   CO2 27 02/08/2015 0952   BUN 18.5 02/08/2015 0952   CREATININE 0.7 02/08/2015 0952      Component Value Date/Time   CALCIUM 10.1 02/08/2015 0952   ALKPHOS 28* 02/08/2015 0952   AST 20 02/08/2015 0952   ALT 26  02/08/2015 0952   BILITOT 0.74 02/08/2015 0952         Impression and Plan:   44 year old woman with the following issues:  1. History of ITP: She appears to have been diagnosed as a child and a course of her disease has been chronic relapsing. She is status post splenectomy as well as many other treatments in the past that includes steroids, IVIG and Rituxan.   She relapsed in May 2016 after presenting with petechiae and platelet count of less than 5000. After IVIG and prednisone 60 mg daily she achieved complete hematological response. She completed a slow prednisone taper starting at 60 mg per day and decreasing by 10 mg every week. She tolerated this taper relatively well. Her platelet count is normal at this time.   2. Follow-up: Will be in one month to check her counts and for reevaluation.   Tiana Loft E, PA-C  7/27/20169:51 AM

## 2015-03-17 NOTE — Patient Instructions (Signed)
Follow up in one month.

## 2015-04-19 ENCOUNTER — Telehealth: Payer: Self-pay | Admitting: Oncology

## 2015-04-19 ENCOUNTER — Ambulatory Visit (HOSPITAL_BASED_OUTPATIENT_CLINIC_OR_DEPARTMENT_OTHER): Payer: BLUE CROSS/BLUE SHIELD | Admitting: Oncology

## 2015-04-19 ENCOUNTER — Other Ambulatory Visit (HOSPITAL_BASED_OUTPATIENT_CLINIC_OR_DEPARTMENT_OTHER): Payer: BLUE CROSS/BLUE SHIELD

## 2015-04-19 VITALS — BP 116/68 | HR 80 | Temp 97.8°F | Resp 16 | Wt 217.4 lb

## 2015-04-19 DIAGNOSIS — Z86718 Personal history of other venous thrombosis and embolism: Secondary | ICD-10-CM

## 2015-04-19 DIAGNOSIS — D693 Immune thrombocytopenic purpura: Secondary | ICD-10-CM

## 2015-04-19 LAB — CBC WITH DIFFERENTIAL/PLATELET
BASO%: 1.1 % (ref 0.0–2.0)
BASOS ABS: 0.1 10*3/uL (ref 0.0–0.1)
EOS%: 8.6 % — AB (ref 0.0–7.0)
Eosinophils Absolute: 0.6 10*3/uL — ABNORMAL HIGH (ref 0.0–0.5)
HEMATOCRIT: 47.5 % — AB (ref 34.8–46.6)
HEMOGLOBIN: 15.7 g/dL (ref 11.6–15.9)
LYMPH#: 2.7 10*3/uL (ref 0.9–3.3)
LYMPH%: 39.2 % (ref 14.0–49.7)
MCH: 29.4 pg (ref 25.1–34.0)
MCHC: 33 g/dL (ref 31.5–36.0)
MCV: 89.1 fL (ref 79.5–101.0)
MONO#: 0.7 10*3/uL (ref 0.1–0.9)
MONO%: 10.8 % (ref 0.0–14.0)
NEUT%: 40.3 % (ref 38.4–76.8)
NEUTROS ABS: 2.8 10*3/uL (ref 1.5–6.5)
Platelets: 263 10*3/uL (ref 145–400)
RBC: 5.33 10*6/uL (ref 3.70–5.45)
RDW: 12.9 % (ref 11.2–14.5)
WBC: 6.9 10*3/uL (ref 3.9–10.3)

## 2015-04-19 NOTE — Progress Notes (Signed)
Hematology and Oncology Follow Up Visit  Bonnie Mendez [id number] [birth year] 44 y.o. 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 She also has deep vein thrombosis diagnosed in 2005 associated with pregnancy.    Prior Therapy: She required multiple therapies including a splenectomy was done in [place] as well as prednisone, IVIG and rituximab. Since she relocated to [facility], she was seen a hematologist in the [place] area as well as occasionally in [place]. Her last relapse was in 12/2014.   She is status post anticoagulation with Coumadin after her deep pain thrombosis but she has been off at this time.   She relapsed in 12/2014 required restarted her prednisone as well as IVIG given at a total of 1 g/kg on 01/11/2015 as well as 01/10/2015. Prednisone tapered completely and July 2016.  Current therapy: Observation and surveillance.  Interim History:  Bonnie Mendez presents today for a follow-up visit. Since her last visit, she is doing well. She has gained close to 25 pounds because of the prednisone and she is trying to lose the weight. She has completed her prednisone taper for relapsed ITP without any other complications. She is not reporting any petechiae. Has not reported any hematochezia or melanoma. Has not reported any epistaxis or hematuria. She has been working without any decline. She is exercising regularly and trying to lose the weight. She had a lot of diet modifications an attempt to do so.  She does not report any headaches, blurry vision, syncope or seizures. She does not report any fevers, chills or sweats. She does not report any chest pain, palpitation, orthopnea or PND. Does not report any cough, wheezing or shortness of breath. Does not report any nausea, vomiting, or change in her bowel habits. She does not report any frequency urgency or hesitancy. She has not reported any  lymphadenopathy or any constitutional symptoms. Rest of the review of systems unremarkable.  Medications: I have reviewed the patient's current medications.  Current Outpatient Prescriptions  Medication Sig Dispense Refill  . Cinnamon 500 MG capsule Take 1,000 mg by mouth.    . Linoleic Acid-Sunflower Oil (CLA) [phone] MG CAPS Take 1,000 mg by mouth.    . Multiple Vitamins-Minerals (THERA-M) TABS Take 1 tablet by mouth.    . phentermine 37.5 MG capsule Take 37.5 mg by mouth.    . polycarbophil (FIBERCON) 625 MG tablet Take 625 mg by mouth.    Marland Kitchen VITAMIN B1-B12 IM Inject into the muscle.     No current facility-administered medications for this visit.     Allergies:  Allergies  Allergen Reactions  . Doxycycline Other (See Comments)    Severe chest pain    Past Medical History, Surgical history, Social history, and Family History were reviewed and updated.   Physical Exam: There were no vitals taken for this visit. ECOG: 0  General appearance: alert and cooperative well-appearing woman without distress. Head: Normocephalic, without obvious abnormality Neck: no adenopathy  Oral mucosa is moist and pink without any mucosal bleeding. Lymph nodes: Cervical, supraclavicular, and axillary nodes normal. Heart:regular rate and rhythm, S1, S2 normal, no murmur, click, rub or gallop Lung:chest clear, no wheezing, rales, normal symmetric air entry Abdomin: soft, non-tender, without masses or organomegaly no shifting dullness or ascites. EXT:no erythema, induration, or nodules.  Skin: No petechiae or ecchymosis noted.    Lab Results: Lab Results  Component Value Date   WBC 6.9 04/19/2015  HGB 15.7 04/19/2015   HCT 47.5* 04/19/2015   MCV 89.1 04/19/2015   PLT 263 04/19/2015     Chemistry      Component Value Date/Time   NA 140 02/08/2015 0952   K 4.5 02/08/2015 0952   CO2 27 02/08/2015 0952   BUN 18.5 02/08/2015 0952   CREATININE 0.7 02/08/2015 0952      Component Value  Date/Time   CALCIUM 10.1 02/08/2015 0952   ALKPHOS 28* 02/08/2015 0952   AST 20 02/08/2015 0952   ALT 26 02/08/2015 0952   BILITOT 0.74 02/08/2015 0952         Impression and Plan:   44 year old woman with the following issues:  1. History of ITP: She appears to have been diagnosed as a child and a course of her disease has been chronic relapsing. She is status post splenectomy as well as many other treatments in the past that includes steroids, IVIG and Rituxan.   She relapsed in May 2016 after presenting with petechiae and platelet count of less than 5000. After IVIG and prednisone 60 mg daily she achieved complete hematological response. She completed a slow prednisone taper starting at 60 mg per day and decreasing by 10 mg every week. Her platelet counts continue to be within normal range. I see no need to keep her on a maintenance prednisone at this time and we'll continue to monitor her closely.  If she develops another relapse IVIG and prednisone would be sufficient to put her back in remission.  2. Follow-up: Will be in 3 months.  [facility], MD 8/31/20168:45 AM

## 2015-04-19 NOTE — Telephone Encounter (Signed)
per pof to sch pt appt-gave pt copy of avs °

## 2015-05-08 ENCOUNTER — Other Ambulatory Visit: Payer: Self-pay | Admitting: Family

## 2015-05-08 DIAGNOSIS — R600 Localized edema: Secondary | ICD-10-CM

## 2015-07-05 ENCOUNTER — Other Ambulatory Visit: Payer: Self-pay

## 2015-07-05 DIAGNOSIS — Z1231 Encounter for screening mammogram for malignant neoplasm of breast: Secondary | ICD-10-CM

## 2015-07-24 ENCOUNTER — Encounter: Payer: Self-pay | Admitting: *Deleted

## 2015-08-01 ENCOUNTER — Ambulatory Visit
Admission: RE | Admit: 2015-08-01 | Discharge: 2015-08-01 | Disposition: A | Discharge status: Home / Self Care | Payer: BLUE CROSS/BLUE SHIELD | Source: Ambulatory Visit

## 2015-08-01 DIAGNOSIS — Z1231 Encounter for screening mammogram for malignant neoplasm of breast: Secondary | ICD-10-CM

## 2015-08-07 ENCOUNTER — Telehealth: Payer: Self-pay | Admitting: *Deleted

## 2015-08-07 ENCOUNTER — Telehealth: Payer: Self-pay | Admitting: Oncology

## 2015-08-07 NOTE — Telephone Encounter (Signed)
Returned call to patient re wanting to know if 1/3 appointments could be moved to 1/2 - closed. Not able to reach patient at work number or home number. Left message on cell.

## 2015-08-07 NOTE — Telephone Encounter (Signed)
Call received in TRIAGE from pt stating she was asked to contact Dr Clelia CroftShadad due to recent medical concerns and history of DVT.  Victorino DikeJennifer states she has 2 fractures in her R foot and " given a boot to wear by the orthopedist "  " the boot makes my leg swell and it is the leg I had a massive blood clot in my thigh ".  " If I do not wear the boot then my foot will not heal properly- so the orthopedist wanted me to contact Dr Clelia CroftShadad about his opinion on what I should do "  Pt is currently off of coumadin and is taking an aspirin nightly as advised by Dr Clelia CroftShadad.   Per call pt understands Dr Clelia CroftShadad is not in the office today- note will be sent to MD and nurse at desk for appropriate recommendation and call returned to her.  Return call number given as [phone] extension [phone].

## 2015-08-08 ENCOUNTER — Telehealth: Payer: Self-pay | Admitting: *Deleted

## 2015-08-08 NOTE — Telephone Encounter (Signed)
Lm on patient identified voice mail, per dr Clelia Croftshadad, okay to wear Boot prescribed by orthopedist.

## 2015-08-22 ENCOUNTER — Other Ambulatory Visit (HOSPITAL_BASED_OUTPATIENT_CLINIC_OR_DEPARTMENT_OTHER): Payer: BLUE CROSS/BLUE SHIELD

## 2015-08-22 ENCOUNTER — Ambulatory Visit (HOSPITAL_BASED_OUTPATIENT_CLINIC_OR_DEPARTMENT_OTHER): Payer: BLUE CROSS/BLUE SHIELD | Admitting: Oncology

## 2015-08-22 VITALS — BP 124/86 | HR 86 | Temp 98.2°F | Resp 18 | Ht 66.0 in | Wt 212.9 lb

## 2015-08-22 DIAGNOSIS — O2230 Deep phlebothrombosis in pregnancy, unspecified trimester: Secondary | ICD-10-CM

## 2015-08-22 DIAGNOSIS — D693 Immune thrombocytopenic purpura: Secondary | ICD-10-CM | POA: Diagnosis not present

## 2015-08-22 LAB — COMPREHENSIVE METABOLIC PANEL
ALT: 17 U/L (ref 0–55)
ANION GAP: 7 meq/L (ref 3–11)
AST: 16 U/L (ref 5–34)
Albumin: 3.8 g/dL (ref 3.5–5.0)
Alkaline Phosphatase: 49 U/L (ref 40–150)
BILIRUBIN TOTAL: 0.59 mg/dL (ref 0.20–1.20)
BUN: 14.1 mg/dL (ref 7.0–26.0)
CHLORIDE: 105 meq/L (ref 98–109)
CO2: 27 meq/L (ref 22–29)
CREATININE: 0.7 mg/dL (ref 0.6–1.1)
Calcium: 8.7 mg/dL (ref 8.4–10.4)
GLUCOSE: 123 mg/dL (ref 70–140)
Potassium: 4.4 mEq/L (ref 3.5–5.1)
SODIUM: 139 meq/L (ref 136–145)
TOTAL PROTEIN: 6.3 g/dL — AB (ref 6.4–8.3)

## 2015-08-22 LAB — CBC WITH DIFFERENTIAL/PLATELET
BASO%: 1 % (ref 0.0–2.0)
Basophils Absolute: 0.1 10*3/uL (ref 0.0–0.1)
EOS%: 3.3 % (ref 0.0–7.0)
Eosinophils Absolute: 0.3 10*3/uL (ref 0.0–0.5)
HEMATOCRIT: 44.2 % (ref 34.8–46.6)
HGB: 14.6 g/dL (ref 11.6–15.9)
LYMPH#: 2.2 10*3/uL (ref 0.9–3.3)
LYMPH%: 25.8 % (ref 14.0–49.7)
MCH: 29 pg (ref 25.1–34.0)
MCHC: 33.1 g/dL (ref 31.5–36.0)
MCV: 87.7 fL (ref 79.5–101.0)
MONO#: 0.8 10*3/uL (ref 0.1–0.9)
MONO%: 9 % (ref 0.0–14.0)
NEUT%: 60.9 % (ref 38.4–76.8)
NEUTROS ABS: 5.3 10*3/uL (ref 1.5–6.5)
PLATELETS: 254 10*3/uL (ref 145–400)
RBC: 5.04 10*6/uL (ref 3.70–5.45)
RDW: 13.8 % (ref 11.2–14.5)
WBC: 8.6 10*3/uL (ref 3.9–10.3)

## 2015-08-22 NOTE — Progress Notes (Signed)
Hematology and Oncology Follow Up Visit  Bonnie Mendez [id number] Jun 10, 1971 44 y.o. 08/22/2015 8:53 AM   Principle Diagnosis: 45 year old woman with a long-standing history of ITP diagnosed when she was 45 years of age. She also has deep vein thrombosis diagnosed in 2005 associated with pregnancy.    Prior Therapy: She required multiple therapies including a splenectomy was done in [place] as well as prednisone, IVIG and rituximab. Since she relocated to [facility], she was seen a hematologist in the [place] area as well as occasionally in [place]. Her last relapse was in 12/2014.   She is status post anticoagulation with Coumadin after her deep pain thrombosis but she has been off at this time.   She relapsed in 12/2014 required restarted her prednisone as well as IVIG given at a total of 1 g/kg on 01/11/2015 as well as 01/10/2015. Prednisone tapered completely and July 2016.  Current therapy: Observation and surveillance.  Interim History:  Bonnie Mendez presents today for a follow-up visit. Since her last visit, she reports no complaints. She reports no bleeding complications. She is not reporting any petechiae. Has not reported any hematochezia or melanoma. Has not reported any epistaxis or hematuria. She continues to perform activities of daily living without any hindrance or decline. She has not reported any recent infections or hospitalizations.  She does not report any headaches, blurry vision, syncope or seizures. She does not report any fevers, chills or sweats. She does not report any chest pain, palpitation, orthopnea or PND. Does not report any cough, wheezing or shortness of breath. Does not report any nausea, vomiting, or change in her bowel habits. She does not report any frequency urgency or hesitancy. She has not reported any lymphadenopathy or any constitutional symptoms. Rest of the review of systems unremarkable.  Medications: I have reviewed the patient's current  medications.  Current Outpatient Prescriptions  Medication Sig Dispense Refill  . Cinnamon 500 MG capsule Take 1,000 mg by mouth.    . Linoleic Acid-Sunflower Oil (CLA) [phone] MG CAPS Take 1,000 mg by mouth.    . Multiple Vitamins-Minerals (THERA-M) TABS Take 1 tablet by mouth.    . phentermine 37.5 MG capsule Take 37.5 mg by mouth.    . polycarbophil (FIBERCON) 625 MG tablet Take 625 mg by mouth.    Marland Kitchen VITAMIN B1-B12 IM Inject into the muscle.     No current facility-administered medications for this visit.     Allergies:  Allergies  Allergen Reactions  . Doxycycline Other (See Comments)    Severe chest pain    Past Medical History, Surgical history, Social history, and Family History were reviewed and updated.   Physical Exam: Blood pressure 124/86, pulse 86, temperature 98.2 F (36.8 C), temperature source Oral, resp. rate 18, height 5\' 6"  (1.676 m), weight 212 lb 14.4 oz (96.571 kg), SpO2 100 %. ECOG: 0  General appearance: alert and cooperative well-appearing without distress. Head: Normocephalic, without obvious abnormality no oral ulcers or lesions. Neck: no adenopathy  Lymph nodes: Cervical, supraclavicular, and axillary nodes normal. Heart:regular rate and rhythm, S1, S2 normal, no murmur, click, rub or gallop Lung:chest clear, no wheezing, rales, normal symmetric air entry Abdomin: soft, non-tender, without masses or organomegaly no rebound or guarding. EXT:no erythema, induration, or nodules.  Skin: No petechiae or ecchymosis noted.    Lab Results: Lab Results  Component Value Date   WBC 8.6 08/22/2015   HGB 14.6 08/22/2015   HCT 44.2 08/22/2015   MCV 87.7 08/22/2015   PLT  254 08/22/2015     Chemistry      Component Value Date/Time   NA 140 02/08/2015 0952   K 4.5 02/08/2015 0952   CO2 27 02/08/2015 0952   BUN 18.5 02/08/2015 0952   CREATININE 0.7 02/08/2015 0952      Component Value Date/Time   CALCIUM 10.1 02/08/2015 0952   ALKPHOS 28*  02/08/2015 0952   AST 20 02/08/2015 0952   ALT 26 02/08/2015 0952   BILITOT 0.74 02/08/2015 0952         Impression and Plan:   45 year old woman with the following issues:  1. History of ITP: She appears to have been diagnosed as a child and a course of her disease has been chronic relapsing. She is status post splenectomy as well as many other treatments in the past that includes steroids, IVIG and Rituxan.   She relapsed in May 2016 after presenting with petechiae and platelet count of less than 5000. After IVIG and prednisone 60 mg daily she achieved complete hematological response.   Laboratory data were reviewed today and her platelet count appear to be in normal range. She has no evidence of relapse at this time and I plan to continue with active surveillance. I will check her platelet count and 4 months.  If she develops another relapse IVIG and prednisone would be sufficient to put her back in remission.  2. Follow-up: Will be in 4 months.  [facility],Lewie Deman, MD 1/3/20178:53 AM

## 2015-08-24 ENCOUNTER — Other Ambulatory Visit: Payer: Self-pay | Admitting: Podiatry

## 2015-08-24 DIAGNOSIS — M810 Age-related osteoporosis without current pathological fracture: Secondary | ICD-10-CM

## 2015-08-27 ENCOUNTER — Telehealth: Payer: Self-pay | Admitting: Oncology

## 2015-08-27 NOTE — Telephone Encounter (Signed)
Left message for patient confirming 5/3 lab/fu.

## 2015-11-03 ENCOUNTER — Other Ambulatory Visit: Payer: Self-pay | Admitting: Obstetrics & Gynecology

## 2015-11-03 ENCOUNTER — Other Ambulatory Visit (HOSPITAL_COMMUNITY)
Admission: RE | Admit: 2015-11-03 | Discharge: 2015-11-03 | Disposition: A | Discharge status: Home / Self Care | Payer: BLUE CROSS/BLUE SHIELD | Source: Ambulatory Visit | Attending: Obstetrics & Gynecology | Admitting: Obstetrics & Gynecology

## 2015-11-03 DIAGNOSIS — Z1151 Encounter for screening for human papillomavirus (HPV): Secondary | ICD-10-CM | POA: Diagnosis present

## 2015-11-03 DIAGNOSIS — Z01411 Encounter for gynecological examination (general) (routine) with abnormal findings: Secondary | ICD-10-CM | POA: Diagnosis present

## 2015-11-06 LAB — CYTOLOGY - PAP

## 2015-11-28 ENCOUNTER — Other Ambulatory Visit: Payer: Self-pay | Admitting: Podiatry

## 2015-11-28 DIAGNOSIS — R5381 Other malaise: Secondary | ICD-10-CM

## 2015-12-05 ENCOUNTER — Other Ambulatory Visit: Payer: Self-pay | Admitting: Obstetrics & Gynecology

## 2015-12-20 ENCOUNTER — Ambulatory Visit: Payer: BLUE CROSS/BLUE SHIELD | Admitting: Oncology

## 2015-12-20 ENCOUNTER — Other Ambulatory Visit: Payer: BLUE CROSS/BLUE SHIELD

## 2016-03-16 IMAGING — MG MM DIGITAL SCREENING BILAT W/ CAD
4 series · 4 of 4 positions shown · non-contrast
Comparison: Previous exam(s).

CLINICAL DATA: Screening.

EXAM:
DIGITAL SCREENING BILATERAL MAMMOGRAM WITH CAD

[R CC]
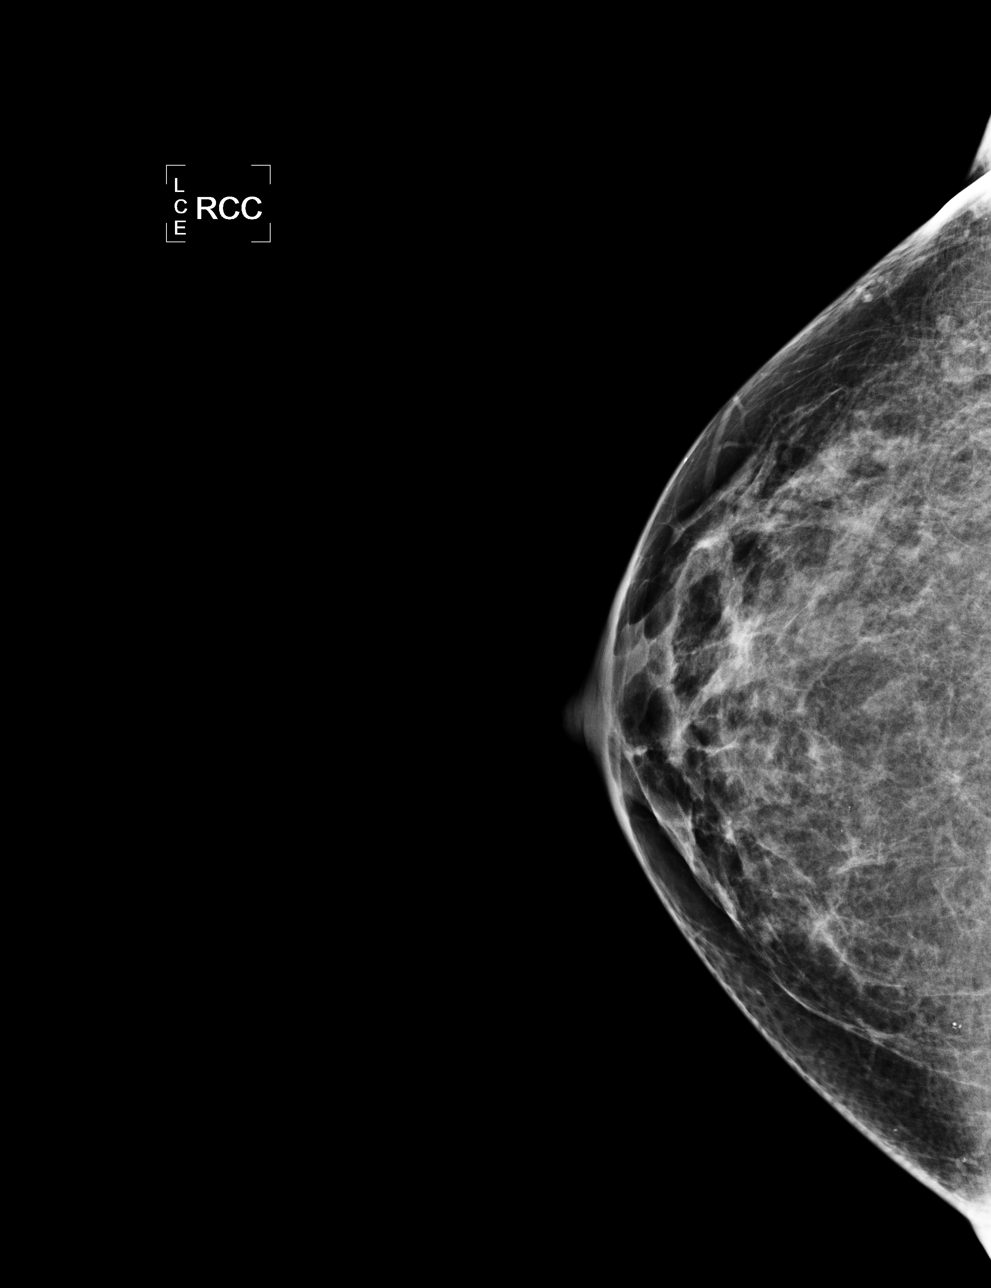

[L CC]
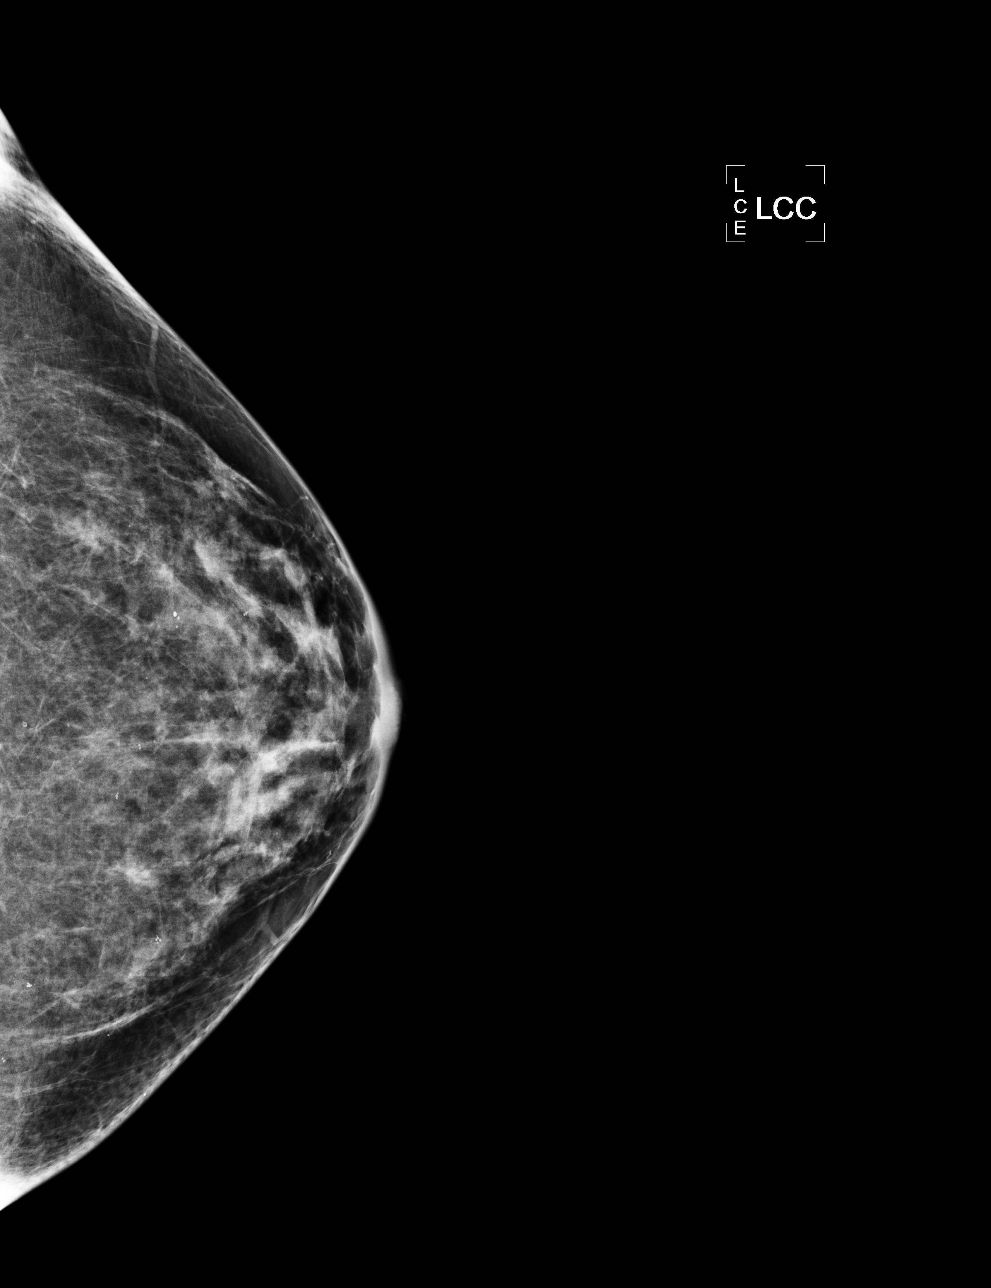

[L MLO]
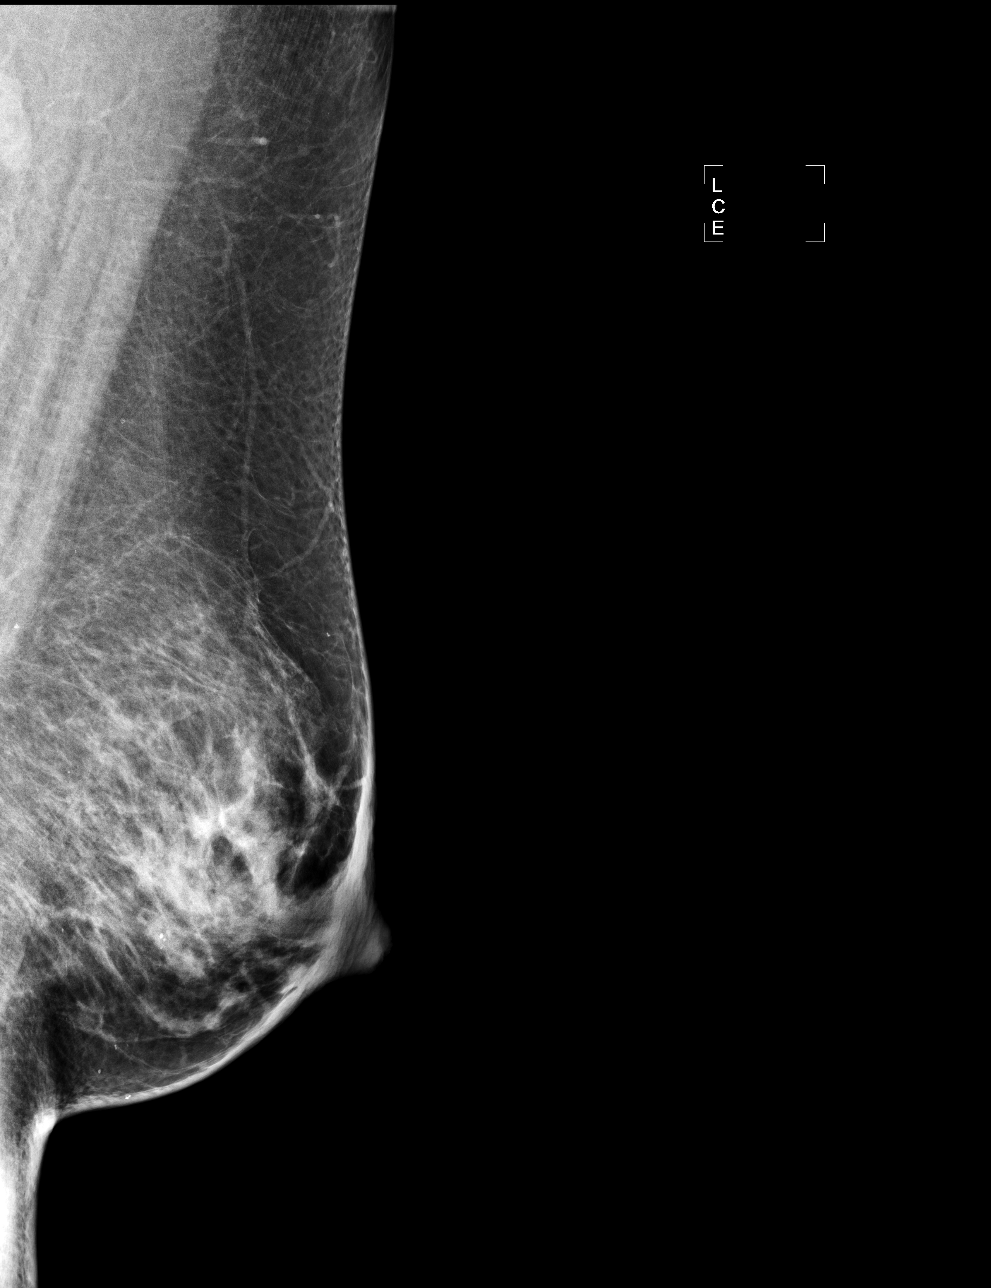

[R MLO]
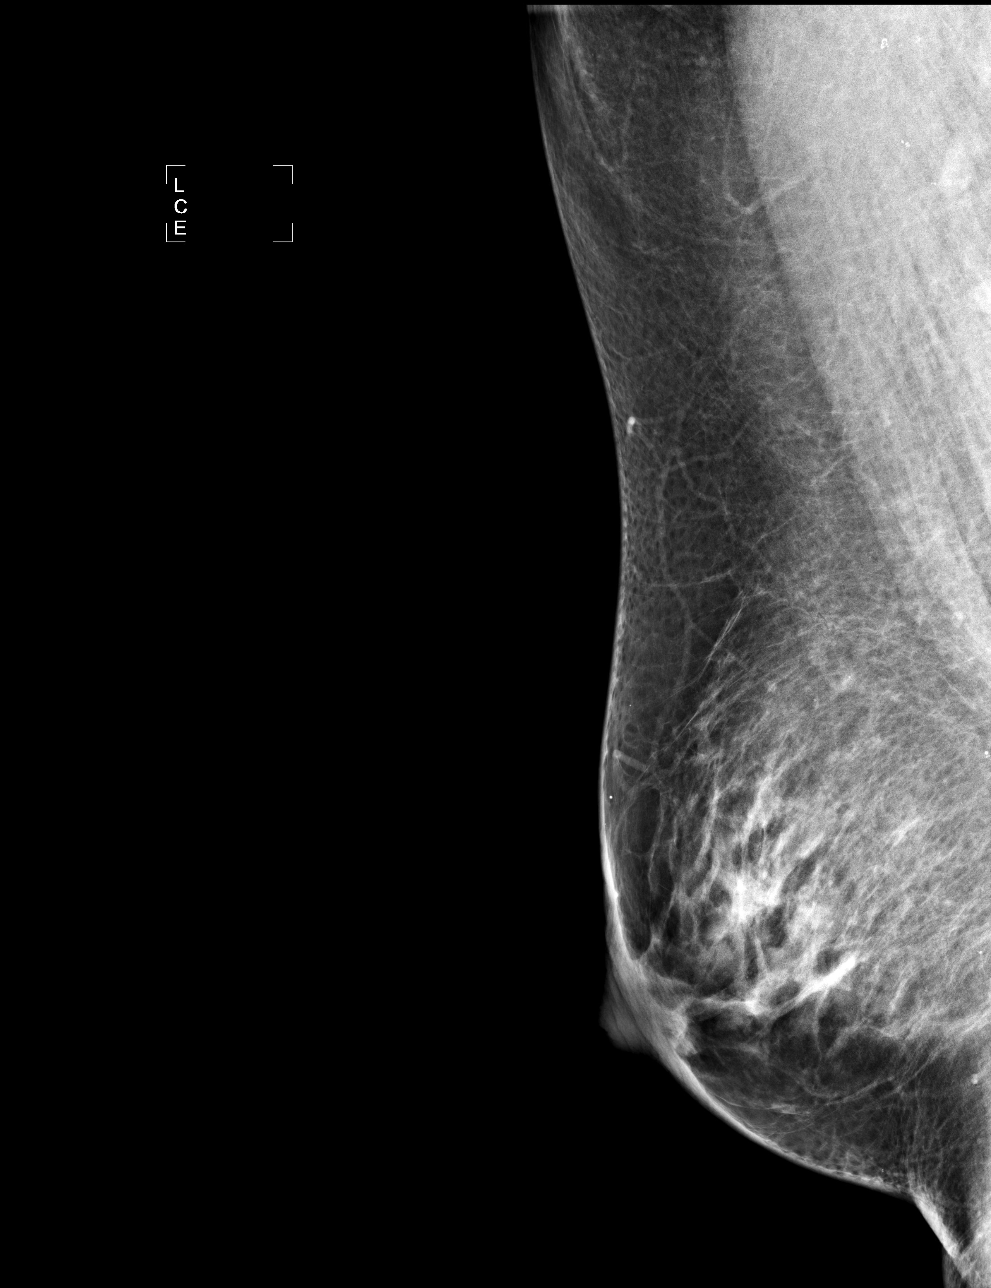

[4 of 4 positions shown; findings below may reference images not displayed]

ACR Breast Density Category b: There are scattered areas of
fibroglandular density.
FINDINGS: There are no findings suspicious for malignancy. Images were
processed with CAD.
IMPRESSION: No mammographic evidence of malignancy. A result letter of this
screening mammogram will be mailed directly to the patient.

RECOMMENDATION:
Screening mammogram in one year. (Code:AS-G-LCT)

BI-RADS CATEGORY  1: Negative.

## 2024-03-26 ENCOUNTER — Other Ambulatory Visit: Payer: Self-pay | Admitting: Medical Genetics

## 2024-06-09 ENCOUNTER — Other Ambulatory Visit: Payer: Self-pay | Admitting: Medical Genetics

## 2024-06-09 DIAGNOSIS — Z006 Encounter for examination for normal comparison and control in clinical research program: Secondary | ICD-10-CM
